# Patient Record
Sex: Male | Born: 1940 | Hispanic: No | Marital: Married | Smoking: Former smoker
Health system: Southern US, Community
[De-identification: ages and names within clinical notes are randomized; demographics above are authoritative.]

## PROBLEM LIST (undated history)

## (undated) DIAGNOSIS — N200 Calculus of kidney: Secondary | ICD-10-CM

## (undated) DIAGNOSIS — G2581 Restless legs syndrome: Secondary | ICD-10-CM

## (undated) DIAGNOSIS — G4730 Sleep apnea, unspecified: Secondary | ICD-10-CM

## (undated) DIAGNOSIS — I4891 Unspecified atrial fibrillation: Secondary | ICD-10-CM

## (undated) HISTORY — DX: Sleep apnea, unspecified: G47.30

## (undated) HISTORY — DX: Unspecified atrial fibrillation: I48.91

## (undated) HISTORY — PX: SKIN CANCER EXCISION: SHX779

---

## 2008-08-30 ENCOUNTER — Encounter: Payer: Self-pay | Admitting: Gastroenterology

## 2009-07-09 HISTORY — PX: POLYPECTOMY: SHX149

## 2009-07-09 HISTORY — PX: COLONOSCOPY: SHX174

## 2009-12-15 ENCOUNTER — Encounter (INDEPENDENT_AMBULATORY_CARE_PROVIDER_SITE_OTHER): Payer: Self-pay | Admitting: *Deleted

## 2010-02-02 ENCOUNTER — Ambulatory Visit: Payer: Self-pay | Admitting: Gastroenterology

## 2010-02-02 DIAGNOSIS — R197 Diarrhea, unspecified: Secondary | ICD-10-CM

## 2010-02-07 ENCOUNTER — Ambulatory Visit: Payer: Self-pay | Admitting: Gastroenterology

## 2010-08-08 NOTE — Assessment & Plan Note (Signed)
Summary: diarrhea...as.   History of Present Illness Visit Type: new patient Primary GI MD: Elie Goody MD [facility] Primary Provider: Tracey Harries, MD Chief Complaint: pt. has had diarrhea episodes since April on and off/never had colonoscopy History of Present Illness:   [name], who noted acute diarrheal illness in April when traveling in [place]. Several family members also had an acute diarrheal illness. His diarrhea improved substantially over the course of 6-8 weeks, but now he has occasional loose stools and occasional lower abdominal discomfort. The symptoms have been minimal for the past month. He has been previously advised to undergo colonoscopy for colorectal cancer screening.   GI Review of Systems    Reports bloating.      Denies abdominal pain, acid reflux, belching, chest pain, dysphagia with liquids, dysphagia with solids, heartburn, loss of appetite, nausea, vomiting, vomiting blood, weight loss, and  weight gain.      Reports diarrhea.     Denies anal fissure, black tarry stools, change in bowel habit, constipation, diverticulosis, fecal incontinence, heme positive stool, hemorrhoids, irritable bowel syndrome, jaundice, light color stool, liver problems, rectal bleeding, and  rectal pain.   Current Medications (verified): 1)  Folic Acid 1 Mg Tabs (Folic Acid) .... Take By Mouth Once Daily 2)  Calcium Carbonate 600 Mg Tabs (Calcium Carbonate) .... 4 Tablets By Mouth Once Daily 3)  C-Pap .... As Directed  Allergies (verified): No Known Drug Allergies  Past History:  Past Medical History: Atrial Fibrillation Kidney Stones Sleep Apnea  Past Surgical History: Unremarkable  Family History: No FH of Colon Cancer:  Social History: Married, retired Art gallery manager prior smoker, quite 45 years ago, no other tobacco useage 1 alcohol drink daily 2 caffeinated drinks daily  Review of Systems       The patient complains of vision changes.     The pertinent positives and negatives are noted as above and in the HPI. All other ROS were reviewed and were negative.   Vital Signs:  Patient profile:   70 year old male Height:      73 inches Weight:      210 pounds BMI:     27.81 Pulse rate:   76 / minute Pulse rhythm:   regular BP sitting:   110 / 60  (left arm)  Vitals Entered By: Milford Cage NCMA (February 02, 2010 11:13 AM)  Physical Exam  General:  Well developed, well nourished, no acute distress. Head:  Normocephalic and atraumatic. Eyes:  PERRLA, no icterus. Ears:  Normal auditory acuity. Mouth:  No deformity or lesions, dentition normal. Lungs:  Clear throughout to auscultation. Heart:  Regular rate and rhythm; no murmurs, rubs,  or bruits. Abdomen:  Soft, nontender and nondistended. No masses, hepatosplenomegaly or hernias noted. Normal bowel sounds. Rectal:  deferred until time of colonoscopy.   Pulses:  Normal pulses noted. Extremities:  No clubbing, cyanosis, edema or deformities noted. Neurologic:  Alert and  oriented x4;  grossly normal neurologically. Cervical Nodes:  No significant cervical adenopathy. Inguinal Nodes:  No significant inguinal adenopathy. Psych:  Alert and cooperative. Normal mood and affect.  Impression & Recommendations:  Problem # 1:  DIARRHEA (ICD-787.91) I suspect he had a postinfectious diarrhea. Trial of Align once daily for one month. Rule out colorectal neoplasms, inflammatory bowel disease, and provide colorectal cancer screening. The risks, benefits and alternatives to colonoscopy with possible biopsy and possible polypectomy were discussed with the patient and they consent to proceed. The procedure will  be scheduled electively. Orders: Colonoscopy (Colon)  Patient Instructions: 1)  Colonoscopy brochure given.  2)  Please continue current medications.  3)  Copy sent to : Tracey Harries, MD 4)  The medication list was reviewed and reconciled.  All changed / newly prescribed  medications were explained.  A complete medication list was provided to the patient / caregiver.  Prescriptions: MOVIPREP 100 GM  SOLR (PEG-KCL-NACL-NASULF-NA ASC-C) As per prep instructions.  #1 x 0   Entered by:   Christie Nottingham CMA (AAMA)   Authorized by:   Meryl Dare MD [facility]   Signed by:   Meryl Dare MD [facility] on 02/02/2010   Method used:   Electronically to        [facility] (retail)       [address]       [place], [place]  [address]       Ph: [phone]       Fax: [phone]   RxID:   [id number]

## 2010-08-08 NOTE — Letter (Signed)
Summary: New Patient letter  [facility]  [address] [place], [place] [address]   Phone: [phone]  Fax: [phone]       12/15/2009 MRN: [id number]  [facility] [address] [place], [place]  [address]  Dear Mr. CASSETTA,  Welcome to the Gastroenterology Division at [facility].    You are scheduled to see Dr.  Russella Dar on 02/02/2010 at 11:00AM on the 3rd floor at [facility], [address]  We ask that you try to arrive at our office 15 minutes prior to your appointment time to allow for check-in.  We would like you to complete the enclosed self-administered evaluation form prior to your visit and bring it with you on the day of your appointment.  We will review it with you.  Also, please bring a complete list of all your medications or, if you prefer, bring the medication bottles and we will list them.  Please bring your insurance card so that we may make a copy of it.  If your insurance requires a referral to see a specialist, please bring your referral form from your primary care physician.  Co-payments are due at the time of your visit and may be paid by cash, check or credit card.     Your office visit will consist of a consult with your physician (includes a physical exam), any laboratory testing he/she may order, scheduling of any necessary diagnostic testing (e.g. x-ray, ultrasound, CT-scan), and scheduling of a procedure (e.g. Endoscopy, Colonoscopy) if required.  Please allow enough time on your schedule to allow for any/all of these possibilities.    If you cannot keep your appointment, please call [phone] to cancel or reschedule prior to your appointment date.  This allows [place] the opportunity to schedule an appointment for another patient in need of care.  If you do not cancel or reschedule by 5 p.m. the business day prior to your appointment date, you will be charged a $50.00 late cancellation/no-show fee.    Thank you for choosing  [facility] for your medical needs.  We appreciate the opportunity to care for you.  Please visit [place] at our website  to learn more about our practice.                     Sincerely,                                                             The Gastroenterology Division

## 2010-08-08 NOTE — Letter (Signed)
Summary: [facility] Instructions  [facility]  [address] [place], [place] [address]   Phone: [phone]  Fax: [phone]       Marc Willis    1941/06/03    MRN: [id number]        Procedure Day /Date: Tuesday August 2nd, 2011     Arrival Time: 1:00pm     Procedure Time: 2:00pm     Location of Procedure:                    _x_  [facility] (4th Floor)                        PREPARATION FOR COLONOSCOPY WITH MOVIPREP   Starting 5 days prior to your procedure 02/03/10 do not eat nuts, seeds, popcorn, corn, beans, peas,  salads, or any raw vegetables.  Do not take any fiber supplements (e.g. Metamucil, Citrucel, and Benefiber).  THE DAY BEFORE YOUR PROCEDURE         DATE: 02/06/10  DAY: Monday   1.  Drink clear liquids the entire day-NO SOLID FOOD  2.  Do not drink anything colored red or purple.  Avoid juices with pulp.  No orange juice.  3.  Drink at least 64 oz. (8 glasses) of fluid/clear liquids during the day to prevent dehydration and help the prep work efficiently.  CLEAR LIQUIDS INCLUDE: Water Jello Ice Popsicles Tea (sugar ok, no milk/cream) Powdered fruit flavored drinks Coffee (sugar ok, no milk/cream) Gatorade Juice: apple, white grape, white cranberry  Lemonade Clear bullion, consomm, broth Carbonated beverages (any kind) Strained chicken noodle soup Hard Candy                             4.  In the morning, mix first dose of MoviPrep solution:    Empty 1 Pouch A and 1 Pouch B into the disposable container    Add lukewarm drinking water to the top line of the container. Mix to dissolve    Refrigerate (mixed solution should be used within 24 hrs)  5.  Begin drinking the prep at 5:00 p.m. The MoviPrep container is divided by 4 marks.   Every 15 minutes drink the solution down to the next mark (approximately 8 oz) until the full liter is complete.   6.  Follow completed prep with 16 oz of clear liquid of your choice  (Nothing red or purple).  Continue to drink clear liquids until bedtime.  7.  Before going to bed, mix second dose of MoviPrep solution:    Empty 1 Pouch A and 1 Pouch B into the disposable container    Add lukewarm drinking water to the top line of the container. Mix to dissolve    Refrigerate  THE DAY OF YOUR PROCEDURE      DATE: 02/07/10 DAY: Tuesday  Beginning at 9:00 a.m. (5 hours before procedure):         1. Every 15 minutes, drink the solution down to the next mark (approx 8 oz) until the full liter is complete.  2. Follow completed prep with 16 oz. of clear liquid of your choice.    3. You may drink clear liquids until 12:00 (2 HOURS BEFORE PROCEDURE).   MEDICATION INSTRUCTIONS  Unless otherwise instructed, you should take regular prescription medications with a small sip of water   as early as possible the morning of your  procedure.         OTHER INSTRUCTIONS  You will need a responsible adult at least 68 years of age to accompany you and drive you home.   This person must remain in the waiting room during your procedure.  Wear loose fitting clothing that is easily removed.  Leave jewelry and other valuables at home.  However, you may wish to bring a book to read or  an iPod/MP3 player to listen to music as you wait for your procedure to start.  Remove all body piercing jewelry and leave at home.  Total time from sign-in until discharge is approximately 2-3 hours.  You should go home directly after your procedure and rest.  You can resume normal activities the  day after your procedure.  The day of your procedure you should not:   Drive   Make legal decisions   Operate machinery   Drink alcohol   Return to work  You will receive specific instructions about eating, activities and medications before you leave.    The above instructions have been reviewed and explained to me by   Estill Bamberg.     I fully understand and can verbalize these  instructions _____________________________ Date _________

## 2010-08-08 NOTE — Procedures (Signed)
Summary: Colonoscopy  Patient: [name] Note: All result statuses are Final unless otherwise noted.  Tests: (1) Colonoscopy (COL)   COL Colonoscopy           DONE     [facility]     [address]     [place], [place]  [address]           COLONOSCOPY PROCEDURE REPORT           PATIENT:  Marc Willis, Marc Willis  MR#:  [id number]     BIRTHDATE:  April 09, 1941, 69 yrs. old  GENDER:  male     ENDOSCOPIST:  Judie Petit T. Russella Dar, MD, [facility]           PROCEDURE DATE:  02/07/2010     PROCEDURE:  Colonoscopy with snare polypectomy     ASA CLASS:  Class II     INDICATIONS:  1) unexplained diarrhea 2) change in bowel habits     MEDICATIONS:   Fentanyl 100 mcg IV, Versed 10 mg IV     DESCRIPTION OF PROCEDURE:   After the risks benefits and     alternatives of the procedure were thoroughly explained, informed     consent was obtained.  Digital rectal exam was performed and     revealed no abnormalities.   The LB [id number]-H180AL [id number] endoscope     was introduced through the anus and advanced to the cecum, which     was identified by both the appendix and ileocecal valve, limited     by a tortuous and redundant colon, fair prep.    The quality of     the prep was Moviprep fair. Extensive rinsing and suctioning     required to acheive a fair prep. The instrument was then slowly     withdrawn as the colon was fully examined.     <<PROCEDUREIMAGES>>     FINDINGS:  A normal appearing cecum, ileocecal valve, and     appendiceal orifice were identified. The ascending, hepatic     flexure, transverse, splenic flexure, descending, sigmoid colon     appeared unremarkable.  A sessile polyp was found in the rectum.     It was 4 mm in size. Polyp was snared without cautery. Retrieval     was unsuccessful. Retroflexed views in the rectum revealed     internal hemorrhoids, small.  The time to cecum =  10.25  minutes.     The scope was then withdrawn (time =  14.5  min) from the patient     and the procedure  completed.           COMPLICATIONS:  None           ENDOSCOPIC IMPRESSION:     1) 4 mm sessile polyp in the rectum     2) Internal hemorrhoids     RECOMMENDATIONS:     1) High fiber diet with liberal fluid intake     2) Repeat Colonoscopy in 5 years for polyp, tortuous/redundant     colon and a fair bowel prep           Malcolm T. Russella Dar, MD, [facility]           CC: Tracey Harries, MD           n.     Rosalie DoctorVenita Lick. Stark at 02/07/2010 02:51 PM           Lissa Merlin, [phone]  Note: An exclamation mark Marland Kitchen)  indicates a result that was not dispersed into the flowsheet. Document Creation Date: 02/07/2010 2:52 PM _______________________________________________________________________  (1) Order result status: Final Collection or observation date-time: 02/07/2010 14:44 Requested date-time:  Receipt date-time:  Reported date-time:  Referring Physician:   Ordering Physician: Claudette Head [phone]) Specimen Source:  Source: Launa Grill Order Number: [id number] Lab site:

## 2012-07-03 ENCOUNTER — Emergency Department (HOSPITAL_BASED_OUTPATIENT_CLINIC_OR_DEPARTMENT_OTHER)
Admission: EM | Admit: 2012-07-03 | Discharge: 2012-07-03 | Disposition: A | Discharge status: Home / Self Care | Payer: Medicare Other | Attending: Emergency Medicine | Admitting: Emergency Medicine

## 2012-07-03 ENCOUNTER — Telehealth: Payer: Self-pay | Admitting: Gastroenterology

## 2012-07-03 ENCOUNTER — Encounter (HOSPITAL_BASED_OUTPATIENT_CLINIC_OR_DEPARTMENT_OTHER): Payer: Self-pay | Admitting: *Deleted

## 2012-07-03 DIAGNOSIS — Z79899 Other long term (current) drug therapy: Secondary | ICD-10-CM | POA: Insufficient documentation

## 2012-07-03 DIAGNOSIS — Z87442 Personal history of urinary calculi: Secondary | ICD-10-CM | POA: Insufficient documentation

## 2012-07-03 DIAGNOSIS — K5900 Constipation, unspecified: Secondary | ICD-10-CM

## 2012-07-03 DIAGNOSIS — N390 Urinary tract infection, site not specified: Secondary | ICD-10-CM | POA: Insufficient documentation

## 2012-07-03 DIAGNOSIS — N401 Enlarged prostate with lower urinary tract symptoms: Secondary | ICD-10-CM | POA: Insufficient documentation

## 2012-07-03 DIAGNOSIS — G2581 Restless legs syndrome: Secondary | ICD-10-CM | POA: Insufficient documentation

## 2012-07-03 DIAGNOSIS — Z87891 Personal history of nicotine dependence: Secondary | ICD-10-CM | POA: Insufficient documentation

## 2012-07-03 DIAGNOSIS — N400 Enlarged prostate without lower urinary tract symptoms: Secondary | ICD-10-CM

## 2012-07-03 HISTORY — DX: Calculus of kidney: N20.0

## 2012-07-03 HISTORY — DX: Restless legs syndrome: G25.81

## 2012-07-03 LAB — URINALYSIS, ROUTINE W REFLEX MICROSCOPIC
Bilirubin Urine: NEGATIVE
Hgb urine dipstick: NEGATIVE
Ketones, ur: NEGATIVE mg/dL
Nitrite: NEGATIVE
Urobilinogen, UA: 0.2 mg/dL (ref 0.0–1.0)
pH: 7 (ref 5.0–8.0)

## 2012-07-03 MED ORDER — MAGNESIUM CITRATE PO SOLN: 296.0000 mL | Freq: Once | ORAL | Status: DC

## 2012-07-03 MED ORDER — POLYETHYLENE GLYCOL 3350 17 GM/SCOOP PO POWD: 17.0000 g | Freq: Every day | ORAL | Status: DC

## 2012-07-03 MED ORDER — FLEET ENEMA 7-19 GM/118ML RE ENEM
1.0000 | ENEMA | Freq: Once | RECTAL | Status: AC
  Administered 2012-07-03: 1 via RECTAL
  Filled 2012-07-03: qty 1

## 2012-07-03 NOTE — ED Notes (Signed)
Pt. aware u/a is needed, unable to void at present.  Water given to pt.

## 2012-07-03 NOTE — Telephone Encounter (Signed)
Patient's wife reports that the patient is in the ER now for inability to void.  They will call back if they need to see GI prior.

## 2012-07-03 NOTE — ED Provider Notes (Signed)
History     CSN: [id number]  Arrival date & time 07/03/12  1025   First MD Initiated Contact with Patient 07/03/12 1028      Chief Complaint  Patient presents with  . Urinary Tract Infection  . Constipation    (Consider location/radiation/quality/duration/timing/severity/associated sxs/prior treatment) HPI Pt reports intermittent constipation over the last several months. He had a bowel movement 5 days ago after using mineral oil, but none since then. He reports a general ill feeling, but no abdominal pain, vomiting or fever. He also reports dysuria and urinary frequency for the last 2 days.   Past Medical History  Diagnosis Date  . Restless leg syndrome   . Kidney stone     History reviewed. No pertinent past surgical history.  No family history on file.  History  Substance Use Topics  . Smoking status: Former Games developer  . Smokeless tobacco: Never Used  . Alcohol Use: Yes     Comment: occassionally      Review of Systems All other systems reviewed and are negative except as noted in HPI.   Allergies  Review of patient's allergies indicates no known allergies.  Home Medications   Current Outpatient Rx  Name  Route  Sig  Dispense  Refill  . B COMPLEX PO TABS   Oral   Take 1 tablet by mouth daily.         Marland Kitchen FOLIC ACID 1 MG PO TABS   Oral   Take 1 mg by mouth daily.         Marland Kitchen VITAMIN C 500 MG PO TABS   Oral   Take 1,000 mg by mouth daily.           BP 134/85  Pulse 80  Temp 97.9 F (36.6 C) (Oral)  Resp 18  Ht 6\' 1"  (1.854 m)  Wt 215 lb (97.523 kg)  BMI 28.37 kg/m2  SpO2 100%  Physical Exam  Nursing note and vitals reviewed. Constitutional: He is oriented to person, place, and time. He appears well-developed and well-nourished.  HENT:  Head: Normocephalic and atraumatic.  Eyes: EOM are normal. Pupils are equal, round, and reactive to light.  Neck: Normal range of motion. Neck supple.  Cardiovascular: Normal rate, normal heart sounds and  intact distal pulses.   Pulmonary/Chest: Effort normal and breath sounds normal.  Abdominal: Bowel sounds are normal. He exhibits no distension. There is no tenderness.  Genitourinary:       Prostate moderately enlarged but non-tender, there is still in the rectal vault but no large impacted stool ball  Musculoskeletal: Normal range of motion. He exhibits no edema and no tenderness.  Neurological: He is alert and oriented to person, place, and time. He has normal strength. No cranial nerve deficit or sensory deficit.  Skin: Skin is warm and dry. No rash noted.  Psychiatric: He has a normal mood and affect.    ED Course  Procedures (including critical care time)   Labs Reviewed  URINALYSIS, ROUTINE W REFLEX MICROSCOPIC   No results found.   No diagnosis found.    MDM  UA neg, minimal relief with enema. Advised oral MagCitrate, daily Miralax and PCP follow up to evaluate chronic constipation and prostate enlargement. No concern for prostatitis.        Tenya Araque B. Bernette Mayers, MD 07/03/12 [phone]

## 2012-07-03 NOTE — ED Notes (Signed)
Patient states he has not had a BM since 06/28/12 and has had urinary frequency and urgency with urination.  States he is having pressure and discomfort in his lower private area, feels bad.

## 2012-07-03 NOTE — ED Notes (Signed)
Pt. C/o not feeling well over the last few weeks.  C/o increased urinary frequency with burning and constipation since 06/28/12.  Has tried Mineral Oil with minimal relief.  Denies vomiting or fever.

## 2012-07-23 ENCOUNTER — Ambulatory Visit: Payer: Self-pay | Admitting: Gastroenterology

## 2012-07-30 ENCOUNTER — Ambulatory Visit (INDEPENDENT_AMBULATORY_CARE_PROVIDER_SITE_OTHER): Payer: Medicare Other | Admitting: Physician Assistant

## 2012-07-30 ENCOUNTER — Encounter: Payer: Self-pay | Admitting: *Deleted

## 2012-07-30 ENCOUNTER — Telehealth: Payer: Self-pay | Admitting: Gastroenterology

## 2012-07-30 ENCOUNTER — Other Ambulatory Visit (INDEPENDENT_AMBULATORY_CARE_PROVIDER_SITE_OTHER): Payer: Medicare Other

## 2012-07-30 VITALS — BP 106/70 | HR 72 | Ht 72.0 in | Wt 204.6 lb

## 2012-07-30 DIAGNOSIS — R1032 Left lower quadrant pain: Secondary | ICD-10-CM

## 2012-07-30 DIAGNOSIS — G2581 Restless legs syndrome: Secondary | ICD-10-CM

## 2012-07-30 DIAGNOSIS — N400 Enlarged prostate without lower urinary tract symptoms: Secondary | ICD-10-CM

## 2012-07-30 DIAGNOSIS — Z8679 Personal history of other diseases of the circulatory system: Secondary | ICD-10-CM

## 2012-07-30 DIAGNOSIS — D126 Benign neoplasm of colon, unspecified: Secondary | ICD-10-CM

## 2012-07-30 DIAGNOSIS — K5900 Constipation, unspecified: Secondary | ICD-10-CM

## 2012-07-30 DIAGNOSIS — K635 Polyp of colon: Secondary | ICD-10-CM

## 2012-07-30 LAB — BASIC METABOLIC PANEL
CO2: 29 mEq/L (ref 19–32)
Chloride: 104 mEq/L (ref 96–112)
Glucose, Bld: 97 mg/dL (ref 70–99)
Potassium: 4.2 mEq/L (ref 3.5–5.1)
Sodium: 138 mEq/L (ref 135–145)

## 2012-07-30 LAB — CBC WITH DIFFERENTIAL/PLATELET
Eosinophils Relative: 0.7 % (ref 0.0–5.0)
HCT: 42.9 % (ref 39.0–52.0)
Lymphs Abs: 0.9 10*3/uL (ref 0.7–4.0)
Monocytes Relative: 12.5 % — ABNORMAL HIGH (ref 3.0–12.0)
Neutrophils Relative %: 72.8 % (ref 43.0–77.0)
Platelets: 208 10*3/uL (ref 150.0–400.0)
WBC: 6.3 10*3/uL (ref 4.5–10.5)

## 2012-07-30 NOTE — Progress Notes (Signed)
Subjective:    Patient ID: Marc Willis, male    DOB: 08-11-1940, 72 y.o.   MRN: [id number]  HPI  Doy is a pleasant 72 year old white male known to Dr. Russella Dar from prior colonoscopy. This was done in August of 2011 with finding of a 4 mm polyp in the rectum and internal hemorrhoids. He was also noted to have a tortuous, redundant colon. The polyp was not retrieved and there is no path  Available- 5 year followup was recommended. Patient comes in today with complaints of lower Donald pain and constipation . He says he had been having chronic problems with his bowel over the past several months and had an ER visit in December of 2013 with complaints of severe constipation and inability to urinate. He was given an enema in the emergency room and then asked to take a mag citrate citrate prep home which he did. He was able to get his bowels moving in his urination improved. He did not have any x-rays or labs done during that ER visit. UA was negative . Patient states that he is been seen by urology since then, was told that he has mild prostate enlargement and has just started on Flomax.. He has also been taking MiraLax since his ER visit but says she's not taking it every day because he doesn't feel that he needs everyday. He says he uses MiraLax every other day or every third day as needed. He says after that ER visit he stopped taking calcium which she was taking a lot of for his restless legs and also improved his diet with the addition of fiber in a lot more fluids and thinks this has helped his bowels as well. He has not noted any melena or hematochezia. He does state that he is been having constant discomfort in his left lower quadrant over the past 2 weeks. He says last week he had pain continuously and this week his pain has been somewhat less not as sharp but more 8D and dull.     Review of Systems  Constitutional: Negative.   HENT: Negative.   Eyes: Negative.   Respiratory: Negative.     Cardiovascular: Negative.   Gastrointestinal: Positive for abdominal pain and constipation.  Genitourinary: Positive for urgency and frequency.  Musculoskeletal: Negative.   Neurological: Positive for tremors.  Hematological: Negative.   Psychiatric/Behavioral: Negative.    Outpatient Encounter Prescriptions as of 07/30/2012  Medication Sig Dispense Refill  . aspirin 325 MG tablet Take 325 mg by mouth daily.      Marland Kitchen b complex vitamins tablet Take 1 tablet by mouth daily.      . folic acid (FOLVITE) 1 MG tablet Take 1 mg by mouth daily.      . polyethylene glycol powder (GLYCOLAX/MIRALAX) powder Take 17 g by mouth daily.  255 g  0  . Tamsulosin HCl (FLOMAX) 0.4 MG CAPS Take by mouth at bedtime.      . vitamin C (ASCORBIC ACID) 500 MG tablet Take 1,000 mg by mouth daily.      . vitamin E 100 UNIT capsule Take 100 Units by mouth daily.      . Zinc Sulfate (ZINC 15 PO) Take by mouth daily.      . Omega-3 Fatty Acids (FISH OIL) 1000 MG CAPS Take by mouth daily.      . [DISCONTINUED] magnesium citrate solution Take 296 mLs by mouth once.  300 mL  0      No Known Allergies  History  Substance Use Topics  . Smoking status: Former Games developer  . Smokeless tobacco: Never Used  . Alcohol Use: Yes     Comment: occassionally   Patient Active Problem List  Diagnosis  . DIARRHEA  . BPH (benign prostatic hyperplasia)  . RLS (restless legs syndrome)  . Colon polyp  . Hx of atrial fibrillation without current medication     Objective:   Physical Exam well-developed older white male in no acute distress he does have a tremor. Pleasant blood pressure 106/70 pulse 72 height 6 foot weight 204. HEENT; nontraumatic normocephalic EOMI PERRLA sclera anicteric,Neck; Supple no JVD, Cardiovascular; regular rate and rhythm with S1-S2 no murmur or gallop, Pulmonary; clear bilaterally, Abdomen ;soft nondistended no palpable mass or hepatosplenomegaly bowel sounds are active he is tender to very low in the left  lower quadrant there is no guarding or rebound no mass or hepatosplenomegaly no demonstrable hernia. Recta;l exam not done, Extremities; no clubbing cyanosis or edema skin warm and dry, Psych; mood and affect normal and appropriate        Assessment & Plan:  #24 72 year old male with recent significant episode of constipation and component of chronic constipation currently doing well with MiraLax usage #2 BPH-probable component of urinary retention with significant obstipation now resolved #3 left lower quadrant pain x3 weeks-etiology not clear, rule out diverticulitis, other intra-abdominal inflammatory process versus a radiculopathy(patient mentions occasional sharp pain radiating into the testicle and also into the foot on the left) #4 colon polyp-type unclear-last colonoscopy August 2011 due for followup 2016 #5 restless leg syndrome  Plan; and 10 units MiraLax 17 g in 8 ounces of water daily or every other day as needed CBC and BMET today Schedule for CT scan of the abdomen and pelvis-further  management pending results

## 2012-07-30 NOTE — Patient Instructions (Addendum)
Please go to the basement level to have your labs drawn.  Continue the Miralax, 17 grams in 8 oz of water, as needed for constipation.  You have been scheduled for a CT scan of the abdomen and pelvis at [facility] ([address]---this is in the same building as [facility]).   You are scheduled on Friday 08-01-2012  at 3:00 Pm . You should arrive 15 minutes prior to your appointment time for registration. Please follow the written instructions below on the day of your exam:  WARNING: IF YOU ARE ALLERGIC TO IODINE/X-RAY DYE, PLEASE NOTIFY RADIOLOGY IMMEDIATELY AT [id number]! YOU WILL BE GIVEN A 13 HOUR PREMEDICATION PREP.  1) Do not eat or drink anything after 11:00 am  (4 hours prior to your test) 2) You have been given 2 bottles of oral contrast to drink. The solution may taste better if refrigerated, but do NOT add ice or any other liquid to this solution. Shake   well before drinking.    Drink 1 bottle of contrast @ 1:00 PM  (2 hours prior to your exam)  Drink 1 bottle of contrast @ 2:00 PM  (1 hour prior to your exam)  You may take any medications as prescribed with a small amount of water except for the following: Metformin, Glucophage, Glucovance, Avandamet, Riomet, Fortamet, Actoplus Met, Janumet, Glumetza or Metaglip. The above medications must be held the day of the exam AND 48 hours after the exam.  The purpose of you drinking the oral contrast is to aid in the visualization of your intestinal tract. The contrast solution may cause some diarrhea. Before your exam is started, you will be given a small amount of fluid to drink. Depending on your individual set of symptoms, you may also receive an intravenous injection of x-ray contrast/dye. Plan on being at [facility] for 30 minutes or long, depending on the type of exam you are having performed.  If you have any questions regarding your exam or if you need to reschedule, you may call the CT department at  [phone] between the hours of 8:00 am and 5:00 pm, Monday-Friday.  ________________________________________________________________________

## 2012-07-30 NOTE — Progress Notes (Signed)
Reviewed and agree with management plan.  Veer Elamin T. Labria Wos, MD FACG 

## 2012-07-30 NOTE — Telephone Encounter (Signed)
Patient recently seen in the ER for constipation and the inability to void.  He was seen at [facility] and they recommend he follow up here.  He will come in and see Amy Esterwood PA today at 1:30.  I have contacted Dr. Madilyn Hook office for records, they will send the records

## 2012-07-31 ENCOUNTER — Other Ambulatory Visit (INDEPENDENT_AMBULATORY_CARE_PROVIDER_SITE_OTHER): Payer: Medicare Other

## 2012-07-31 ENCOUNTER — Telehealth: Payer: Self-pay | Admitting: Physician Assistant

## 2012-07-31 ENCOUNTER — Other Ambulatory Visit: Payer: Self-pay | Admitting: *Deleted

## 2012-07-31 DIAGNOSIS — R718 Other abnormality of red blood cells: Secondary | ICD-10-CM

## 2012-07-31 DIAGNOSIS — R109 Unspecified abdominal pain: Secondary | ICD-10-CM

## 2012-07-31 DIAGNOSIS — E538 Deficiency of other specified B group vitamins: Secondary | ICD-10-CM

## 2012-07-31 DIAGNOSIS — R197 Diarrhea, unspecified: Secondary | ICD-10-CM

## 2012-07-31 LAB — FOLATE: Folate: >24.8 ng/mL (ref >5.9)

## 2012-07-31 NOTE — Telephone Encounter (Signed)
Left a message for patient to call me. 

## 2012-07-31 NOTE — Telephone Encounter (Signed)
Spoke with patient and gave him lab results as per Mike Gip, PA. Explained it is ok that he did not take his B complex yesterday.

## 2012-08-01 ENCOUNTER — Ambulatory Visit (INDEPENDENT_AMBULATORY_CARE_PROVIDER_SITE_OTHER)
Admission: RE | Admit: 2012-08-01 | Discharge: 2012-08-01 | Disposition: A | Discharge status: Home / Self Care | Payer: Medicare Other | Source: Ambulatory Visit | Attending: Physician Assistant | Admitting: Physician Assistant

## 2012-08-01 DIAGNOSIS — R1032 Left lower quadrant pain: Secondary | ICD-10-CM

## 2012-08-01 MED ORDER — IOHEXOL 300 MG/ML  SOLN
100.0000 mL | Freq: Once | INTRAMUSCULAR | Status: AC | PRN
  Administered 2012-08-01: 100 mL via INTRAVENOUS

## 2013-08-13 IMAGING — CT CT ABD-PELV W/ CM
2 of 4 series · 16 of 46 positions shown, 18 images · IV contrast (Omnipaque 300)
Comparison: No priors.

CLINICAL DATA: Left lower quadrant abdominal pain for the past 3
weeks.

CT ABDOMEN AND PELVIS WITH CONTRAST
TECHNIQUE: Multidetector CT imaging of the abdomen and pelvis was
performed following the standard protocol during bolus
administration of intravenous contrast.
Contrast: 100mL OMNIPAQUE IOHEXOL 300 MG/ML  SOLN

[Series 2: abd/ pel 5mm · axial · 0.78mm/px · z∈[-555,-140]mm · 13 of 97 slices shown, 15 images]
[im 7/97  soft-tissue]
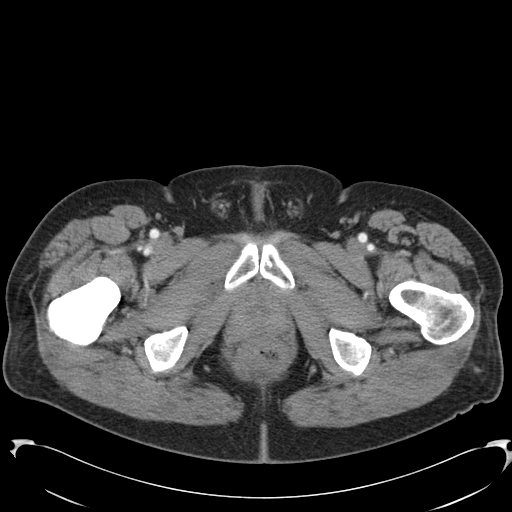
[im 7/97  bone]
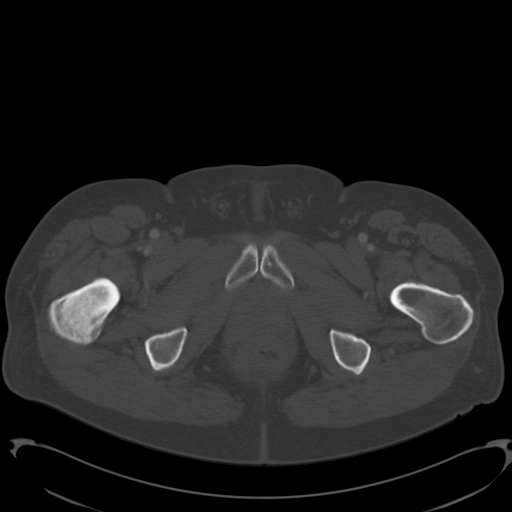
[im 13/97  soft-tissue]
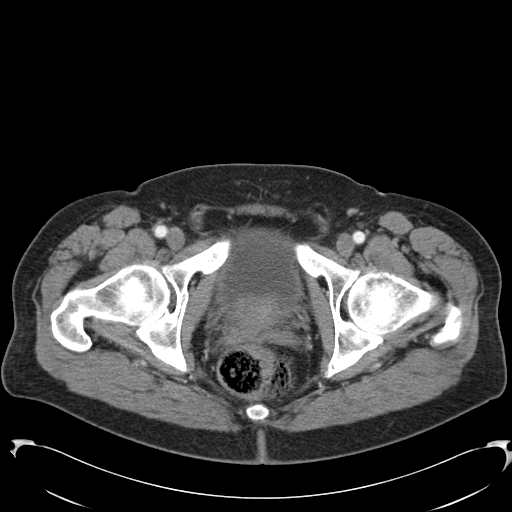
[im 20/97  soft-tissue]
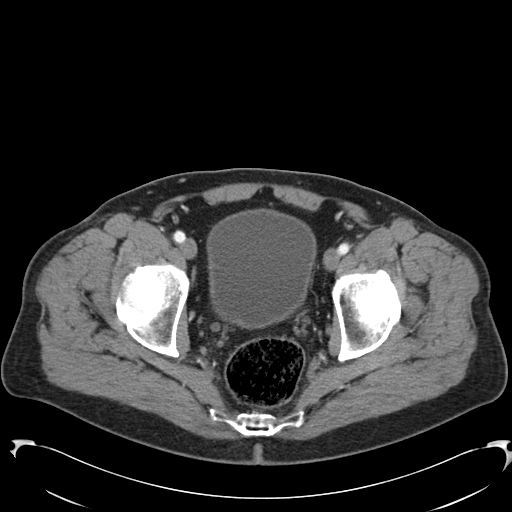
[im 26/97  soft-tissue]
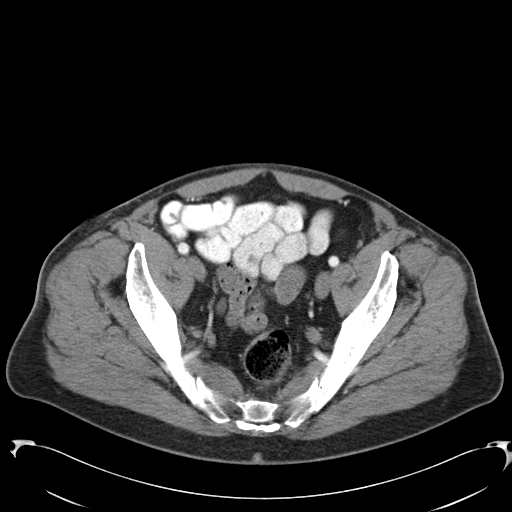
[im 33/97  soft-tissue]
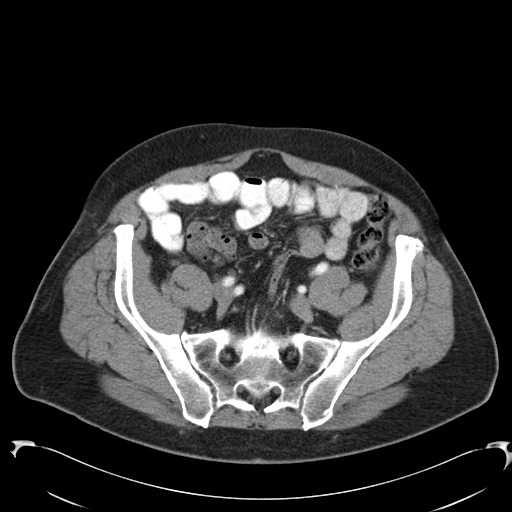
[im 39/97  soft-tissue]
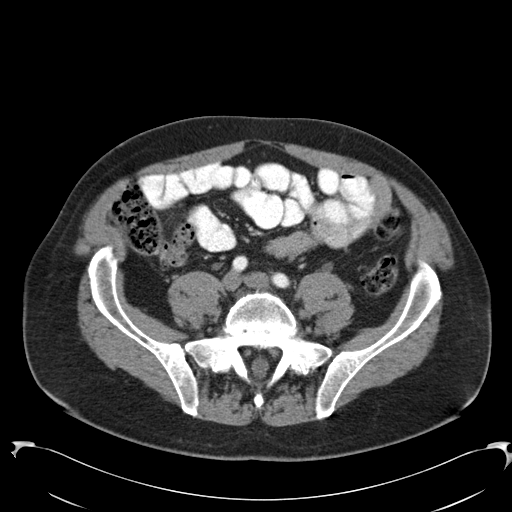
[im 52/97  soft-tissue]
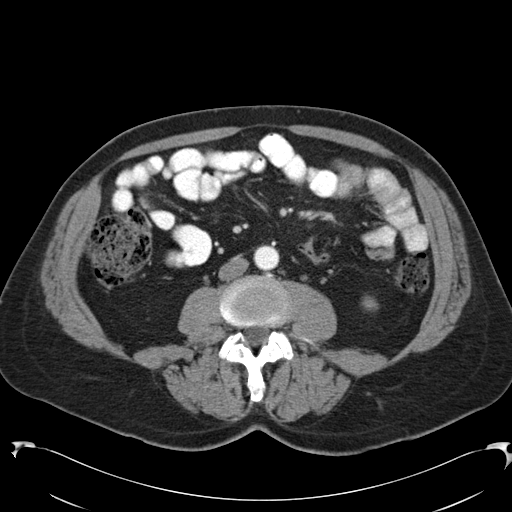
[im 58/97  soft-tissue]
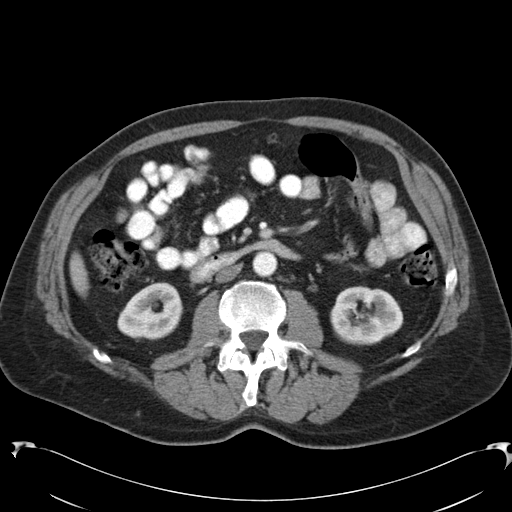
[im 65/97  soft-tissue]
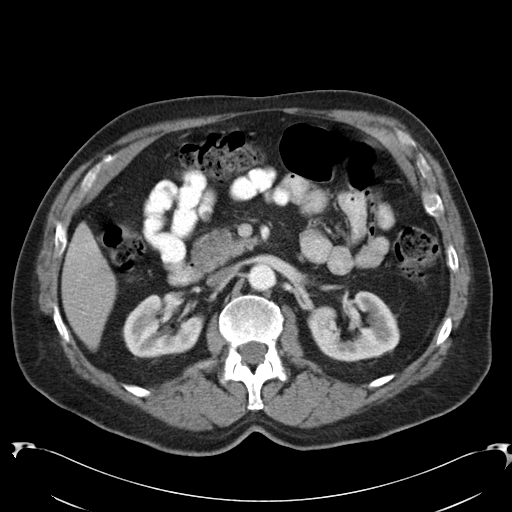
[im 65/97  bone]
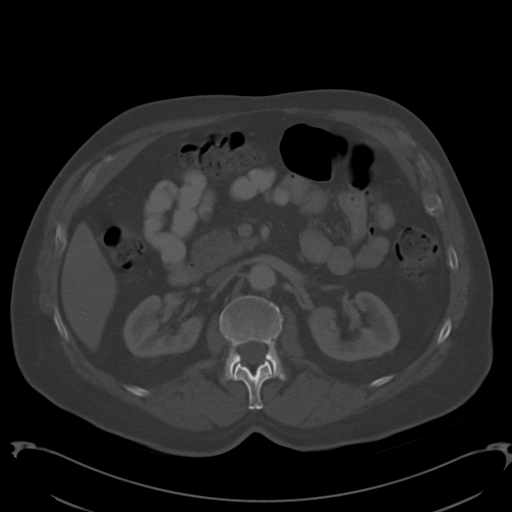
[im 71/97  soft-tissue]
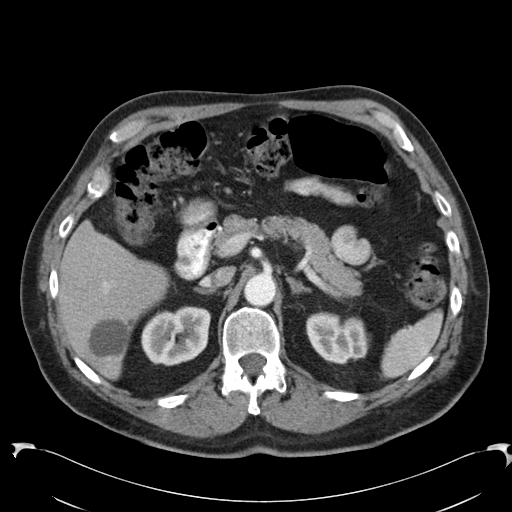
[im 77/97  soft-tissue]
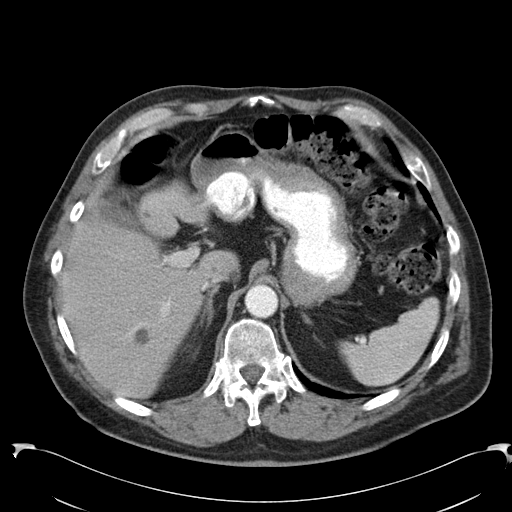
[im 84/97  soft-tissue]
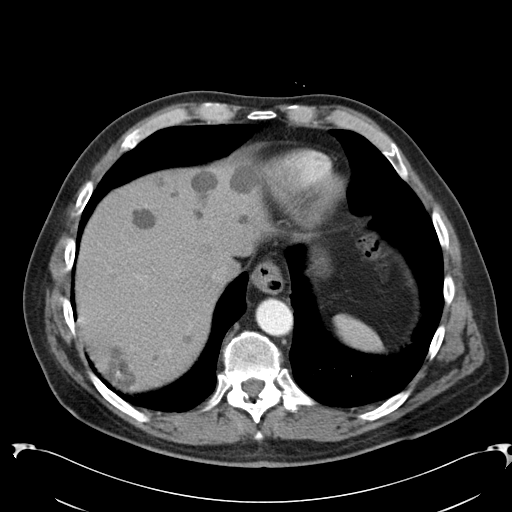
[im 90/97  soft-tissue]
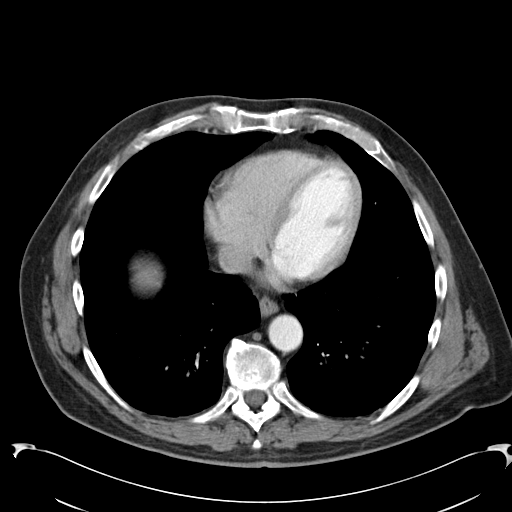

[Series 602: <mpr range> · coronal · 0.98mm/px · 3 of 125 slices shown]
[im 42/125  soft-tissue]
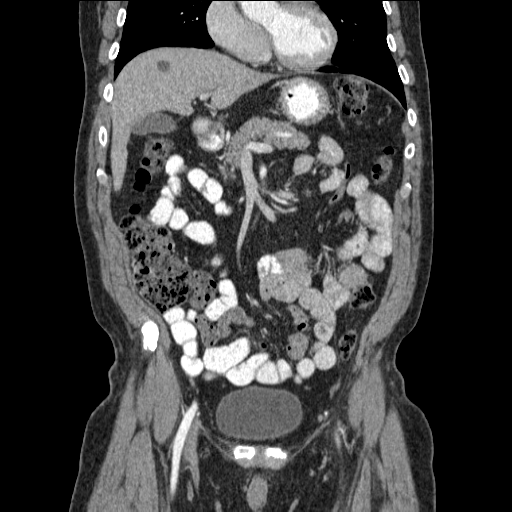
[im 56/125  soft-tissue]
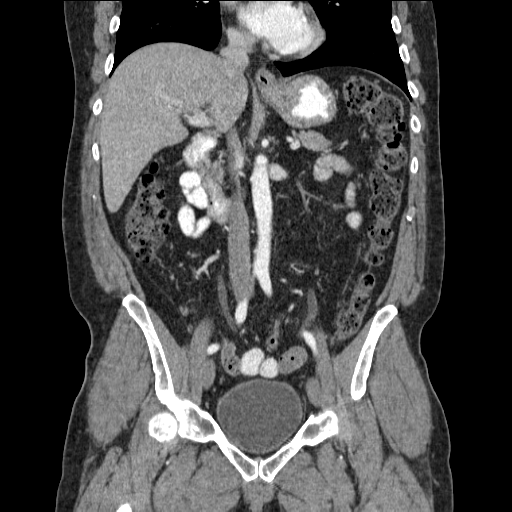
[im 69/125  soft-tissue]
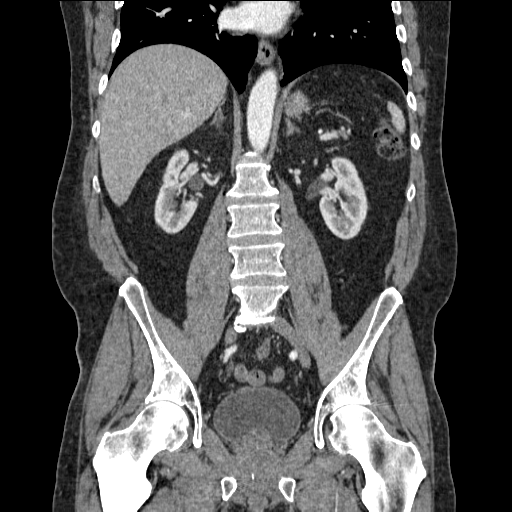

[16 of 46 positions shown; findings below may reference images not displayed]

FINDINGS: Lung Bases: 3 mm subpleural nodule in the periphery of the lateral
segment of the right middle lobe (image 80 of series 3). Small
hiatal hernia.

Abdomen/Pelvis:  Numerous low-attenuation lesions are seen
scattered throughout the hepatic parenchyma.  The majority of these
are subcentimeter in size and too small to definitively
characterize.  All of the larger lesions are compatible with simple
cyst, with the largest of these measuring 3.2 x 3.9 cm.  The
cluster of small lesions in the posterior aspect of the right lobe
of the liver has some internal calcifications, likely related to
prior trauma, infection or hemorrhage.  The appearance of the
gallbladder, pancreas, spleen, bilateral adrenal glands and
bilateral kidneys is unremarkable.  There is no ascites or
pneumoperitoneum and no pathologic distension of small bowel.  No
definite pathologic lymphadenopathy identified within the abdomen
or pelvis.  No significant colonic diverticula are noted.  Prostate
and urinary bladder are unremarkable in appearance.

Musculoskeletal: Marked sclerosis in the right proximal femur
predominately in the proximal diaphysis and intertrochanteric
region.  This is incompletely visualized.  No definite expansion of
the bowel.  Mild cortical thickening.  No other aggressive
appearing lytic or blastic lesions are noted in the visualized
portions of the skeleton.
IMPRESSION: 1.  No acute findings in the abdomen or pelvis to account for the
patient's symptoms.  Specifically, no significant diverticular
disease or findings to suggest acute diverticulitis.
2.  Unusual appearing sclerotic lesion in the right proximal femur,
as above.  This is incompletely visualized.  Correlation with
dedicated radiographs of the right femur is recommended to better
evaluate this finding.
3.  Multiple low attenuation lesions in the liver.  The largest of
these lesions are compatible with simple cysts, while the smaller
lesions are too small to definitively characterize (but also likely
represent small cysts).

4.  Small hiatal hernia.

## 2014-03-17 ENCOUNTER — Encounter: Payer: Self-pay | Admitting: Gastroenterology

## 2015-01-03 ENCOUNTER — Encounter: Payer: Self-pay | Admitting: Gastroenterology

## 2015-01-06 ENCOUNTER — Encounter: Payer: Self-pay | Admitting: Gastroenterology

## 2015-02-16 ENCOUNTER — Ambulatory Visit (AMBULATORY_SURGERY_CENTER): Payer: Self-pay

## 2015-02-16 ENCOUNTER — Telehealth: Payer: Self-pay

## 2015-02-16 VITALS — Ht 72.0 in | Wt 211.0 lb

## 2015-02-16 DIAGNOSIS — Z860100 Personal history of colon polyps, unspecified: Secondary | ICD-10-CM

## 2015-02-16 MED ORDER — NA SULFATE-K SULFATE-MG SULF 17.5-3.13-1.6 GM/177ML PO SOLN: 1.0000 | Freq: Once | ORAL | Status: DC

## 2015-02-16 NOTE — Progress Notes (Signed)
No egg or soy allergies Not on home 02 No previous anesthesia complications No diet or weight loss meds Pt will do 2 day prep due to prev results

## 2015-02-16 NOTE — Telephone Encounter (Signed)
Gave pt instructions for 2 day prep per Dr. Lynne Leader recall. Pt said okay and understood. Adv pt will mail additional instructions today.

## 2015-03-02 ENCOUNTER — Encounter: Payer: Self-pay | Admitting: Gastroenterology

## 2015-03-02 ENCOUNTER — Ambulatory Visit (AMBULATORY_SURGERY_CENTER): Payer: Medicare Other | Admitting: Gastroenterology

## 2015-03-02 VITALS — BP 101/71 | HR 66 | Temp 97.0°F | Resp 37 | Ht 73.0 in | Wt 211.0 lb

## 2015-03-02 DIAGNOSIS — D122 Benign neoplasm of ascending colon: Secondary | ICD-10-CM

## 2015-03-02 DIAGNOSIS — Z86010 Personal history of colonic polyps: Secondary | ICD-10-CM

## 2015-03-02 MED ORDER — SODIUM CHLORIDE 0.9 % IV SOLN: 500.0000 mL | INTRAVENOUS | Status: DC

## 2015-03-02 NOTE — Progress Notes (Signed)
Transferred to recovery room. A/O x3, pleased with MAC.  VSS.  Report to Jane, RN. 

## 2015-03-02 NOTE — Progress Notes (Signed)
Called to room to assist during endoscopic procedure.  Patient ID and intended procedure confirmed with present staff. Received instructions for my participation in the procedure from the performing physician.  

## 2015-03-02 NOTE — Op Note (Signed)
[facility]  [address] [place], [id number]   COLONOSCOPY PROCEDURE REPORT  PATIENT: Marc Willis, Marc Willis  MR#: [id number] BIRTHDATE: Jan 21, 1941 , 74  yrs. old GENDER: male ENDOSCOPIST: Ladene Artist, MD, [facility] REFERRED [id number] Coletta Memos, M.D. PROCEDURE DATE:  03/02/2015 PROCEDURE:   Colonoscopy, surveillance and Colonoscopy with snare polypectomy First Screening Colonoscopy - Avg.  risk and is 50 yrs.  old or older - No.  Prior Negative Screening - Now for repeat screening. N/A  History of Adenoma - Now for follow-up colonoscopy & has been > or = to 3 yrs.  Yes hx of adenoma.  Has been 3 or more years since last colonoscopy.  Polyps removed today? Yes ASA CLASS:   Class III INDICATIONS:Surveillance due to prior colonic neoplasia and PH Colon Adenoma. MEDICATIONS: Monitored anesthesia care and Propofol 300 mg IV DESCRIPTION OF PROCEDURE:   After the risks benefits and alternatives of the procedure were thoroughly explained, informed consent was obtained.  The digital rectal exam revealed no abnormalities of the rectum.   The     endoscope was introduced through the anus and advanced to the cecum, which was identified by both the appendix and ileocecal valve. No adverse events experienced with a tortuous and redundant colon.   The quality of the prep was adequate (Suprep was used) (MiraLax was used)  The instrument was then slowly withdrawn as the colon was fully examined. Estimated blood loss is zero unless otherwise noted in this procedure report.   COLON FINDINGS: Two sessile polyps measuring 7 mm in size were found in the ascending colon.  Polypectomies were performed with a cold snare.  The resection was complete, the polyp tissue was completely retrieved and sent to histology.   The examination was otherwise normal.  Retroflexed views revealed no abnormalities. The time to cecum = 6.3 Withdrawal time = 11.4   The scope was withdrawn and the procedure  completed. COMPLICATIONS: There were no immediate complications.  ENDOSCOPIC IMPRESSION: 1.   Two sessile polyps in the ascending colon; polypectomies performed with a cold snare 2.   The examination was otherwise normal  RECOMMENDATIONS: 1.  Hold Aspirin and all other NSAIDS for 2 weeks. 2.  Await pathology results 3.  Repeat colonoscopy in 5 years if polyp(s) adenomatous again with an extended bowel prep; otherwise, given your age, you will not need another colonoscopy for colon cancer screening or polyp surveillance.  These types of tests usually stop around the age 72.   eSigned:  Ladene Artist, MD, [facility] 03/02/2015 2:01 PM

## 2015-03-02 NOTE — Patient Instructions (Signed)

## 2015-03-03 ENCOUNTER — Telehealth: Payer: Self-pay | Admitting: *Deleted

## 2015-03-03 NOTE — Telephone Encounter (Signed)
No answer, left message to call if questions or concerns. 

## 2015-03-07 ENCOUNTER — Encounter: Payer: Self-pay | Admitting: Gastroenterology

## 2019-12-16 ENCOUNTER — Encounter: Payer: Self-pay | Admitting: Gastroenterology
